# Patient Record
Sex: Male | Born: 1955
Health system: Southern US, Community
[De-identification: ages and names within clinical notes are randomized; demographics above are authoritative.]

## PROBLEM LIST (undated history)

## (undated) DIAGNOSIS — I10 Essential (primary) hypertension: Secondary | ICD-10-CM

---

## 2003-02-19 ENCOUNTER — Encounter: Admission: RE | Admit: 2003-02-19 | Discharge: 2003-02-19 | Payer: Self-pay | Admitting: Endocrinology

## 2003-02-19 ENCOUNTER — Encounter: Payer: Self-pay | Admitting: Endocrinology

## 2004-05-05 ENCOUNTER — Ambulatory Visit (HOSPITAL_COMMUNITY): Admission: RE | Admit: 2004-05-05 | Discharge: 2004-05-05 | Payer: Self-pay | Admitting: Cardiology

## 2008-03-07 ENCOUNTER — Ambulatory Visit (HOSPITAL_BASED_OUTPATIENT_CLINIC_OR_DEPARTMENT_OTHER): Admission: RE | Admit: 2008-03-07 | Discharge: 2008-03-07 | Payer: Self-pay | Admitting: Surgery

## 2008-03-07 ENCOUNTER — Encounter (INDEPENDENT_AMBULATORY_CARE_PROVIDER_SITE_OTHER): Payer: Self-pay | Admitting: Surgery

## 2010-04-15 ENCOUNTER — Ambulatory Visit: Payer: Self-pay | Admitting: Vascular Surgery

## 2010-04-21 ENCOUNTER — Ambulatory Visit (HOSPITAL_COMMUNITY): Admission: RE | Admit: 2010-04-21 | Discharge: 2010-04-21 | Payer: Self-pay | Admitting: Vascular Surgery

## 2010-04-21 ENCOUNTER — Ambulatory Visit: Payer: Self-pay | Admitting: Vascular Surgery

## 2010-05-22 ENCOUNTER — Ambulatory Visit: Payer: Self-pay | Admitting: Vascular Surgery

## 2010-11-19 ENCOUNTER — Ambulatory Visit: Payer: Self-pay | Admitting: Vascular Surgery

## 2010-12-28 LAB — POCT I-STAT, CHEM 8
BUN: 18 mg/dL (ref 6–23)
Creatinine, Ser: 1 mg/dL (ref 0.4–1.5)
Hemoglobin: 15.3 g/dL (ref 13.0–17.0)
Potassium: 4.7 mEq/L (ref 3.5–5.1)
Sodium: 138 mEq/L (ref 135–145)
TCO2: 28 mmol/L (ref 0–100)

## 2011-02-24 NOTE — Assessment & Plan Note (Signed)
OFFICE VISIT   Brian Garrett, Brian Garrett  DOB:  1956-02-26                                       05/22/2010  ZOXWR#:[id number]   I saw the patient in the office today for followup after his recent left  common iliac artery PTA and stent.  This a pleasant 55 year old  gentleman.  He had had a previous right common iliac artery PTA by Dr.  Aleen Campi in 2005.  He presented with claudication in the left leg and a  diminished left femoral pulse.  He was brought in for diagnostic  arteriography.  I performed a PTA and stenting of the left common iliac  artery with a Genesis 8 x 24 balloon-expandable stent.  He did well and  he states that his claudication symptoms resolved after the procedure.  He has had no other problems and has been resuming his normal  activities.   On examination blood pressure is 118/76, heart rate is 84.  The left  groin looks fine with no hematoma.  He has palpable femoral, popliteal  and dorsalis pedis pulses bilaterally.   He had an arterial Doppler study in our office today which showed  triphasic Doppler signals on the left with an ABI of 100% on the left.  The right side he has a triphasic dorsalis pedis signal with a biphasic  posterior tibial signal and an ABI of 100%.   Overall, I am pleased his progress.  He will stay on his Plavix for 3  months and then he will switch to aspirin.  We will see him back in 6  months for a routine duplex scan.  He knows to call sooner if he has  problems.     Di Kindle. Edilia Bo, M.D.  Electronically Signed   CSD/MEDQ  D:  05/22/2010  T:  05/23/2010  Job:  [id number]   cc:   Brooke Bonito, M.D.

## 2011-02-24 NOTE — Consult Note (Signed)
NEW PATIENT CONSULTATION   Brian Garrett, Brian Garrett  DOB:  04/16/1956                                       04/15/2010  AVWUJ#:[id number]   The patient is a 55 year old male patient with a one year history of  severe left thigh and calf claudication symptoms which began at about  100 yards on an incline and requiring him to stop walking at one block  on level ground.  He has no history of rest pain, nonhealing ulcers,  infection or gangrene.  He does have a history of right iliac stent  placement by Dr. Charolette Child in 2005 and since then he has had no  symptoms of the contralateral right leg.  He had a lower extremity  arterial Doppler study performed at [facility] on  02/20/2010 which I have reviewed.  He has an ABI of 0.61 on the left and  1.16 on the right with waveforms consistent with left iliac occlusive  disease.   CHRONIC MEDICAL PROBLEMS:  1. Hypertension.  2. Hyperlipidemia.  3. History of hemorrhoids.  4. Peripheral vascular disease.  5. Negative for diabetes, coronary artery disease, COPD or stroke.   FAMILY HISTORY:  Positive for stroke in his mother at an elderly age of  83.  Negative for coronary artery disease or diabetes.   SOCIAL HISTORY:  He is single, has two children and works in Airline [employer].  He  smokes one to one and a half pack of cigarettes per day up until 2 years  ago for about 30 years and now smokes a pipe and drinks an occasional  beer.   REVIEW OF SYSTEMS:  Negative for anorexia, weight loss, chest pain,  dyspnea on exertion, PND, orthopnea.  Does have reflux esophagitis on  occasion, headaches, lower extremity symptoms as described in present  illness.  All other systems are negative in a full review of systems.   PHYSICAL EXAMINATION:  Vital signs:  Blood pressure 122/82, heart rate  87, respirations 14.  General:  He is a well-developed, well-nourished  male in no apparent distress, alert and oriented times 3.   HEENT:  Exam  is normal.  EOMs intact.  Neck:  Is supple, 3+ carotid pulses.  No  bruits are audible.  Chest:  Is clear with no rhonchi or wheezing.  Cardiovascular:  Regular rhythm.  No murmurs.  Abdomen:  Soft, nontender  with no masses.  Musculoskeletal:  Exam is free of major deformities.  Neurological:  Normal.  Skin:  Free of rashes.  Lower extremity exam  reveals right leg to have a 3+ femoral, popliteal and dorsalis pedis  pulse palpable.  Left leg has 1+ femoral pulse.  No popliteal or distal  pulses palpable.  There is good motion and sensation in both feet.   IMPRESSION:  Suspect the patient has a left iliac stenosis which may  well be amenable to PTA and stenting.  I have scheduled an angiogram  with possible PTA and stenting by Dr. Cari Caraway to be done on Monday  July 11 at [facility].  The risks and benefits have been thoroughly  discussed and the patient would like to proceed.  Hopefully this will  relieve his symptoms.     Brian Garrett, M.D.  Electronically Signed   JDL/MEDQ  D:  04/15/2010  T:  04/16/2010  Job:  [id number]   cc:   Brooke Bonito, M.D.

## 2011-02-24 NOTE — Op Note (Signed)
Brian Garrett, Brian Garrett                ACCOUNT NO.:  [id number]   MEDICAL RECORD NO.:  [id number]          PATIENT TYPE:  AMB   LOCATION:  DSC                          FACILITY:  [facility]   PHYSICIAN:  Thornton Park. Daphine Deutscher, MD  DATE OF BIRTH:  July 10, 1956   DATE OF PROCEDURE:  03/07/2008  DATE OF DISCHARGE:                               OPERATIVE REPORT   PREOPERATIVE DIAGNOSES:  Expanding soft tissue mass in the left  supraclavicular area consistent with lipoma, hemorrhoids.   POSTOPERATIVE DIAGNOSES:  Expanding soft tissue mass in the left  supraclavicular area consistent with lipoma, hemorrhoids.   PROCEDURE:  Excision of left supraclavicular lipoma, exam under  anesthesia, internal hemorrhoidal banding at the 11 o'clock and 5  o'clock positions with the patient in the dorsal lithotomy position,  external hemorrhoidectomy at the 5 o'clock position.   SURGEON:  Thornton Park. Daphine Deutscher, MD   ASSISTANT:  None.   ANESTHESIA:  General.   DESCRIPTION OF PROCEDURE:  Brian Garrett was taken back to room 3 at [facility] on Mar 07, 2008.  The supraclavicular mass was excised going  through a skin line incision and then getting into the old capsule and  this thing shelled out nicely.  I went below the platysma, but did not  encounter any neurovascular structures.  The base was ligated with two 4-  0 Vicryl sutures and this wound was closed with 4-0 Vicryl  subcutaneously.  Dermabond was used on the skin.   Next, went down with reprepped and draped his perianal region with  Techni-Care.  I did an exam under anesthesia and found him to have some  internal hemorrhoids.  The largest one was at the 11 o'clock position  which I double banded.  I then went to the left side of the external  hemorrhoids at the 5 o'clock position and found a fairly mobile internal  component, which I double banded first and then I excised the external  hemorrhoidal component holding it out and then undermined cutting  under  with scissors.  Cautery was used to control the bleeding of the skin and  then was reapproximated from the inside almost completely to the outside  with two interrupted 3-0 chromics.  I left some  area open for oozing if this occurred.  A regional block was done with  0.5% Marcaine with epinephrine into the sphincter muscle.  The patient  seemed to tolerate this procedure well.  Betadine ointment was massaged  into the anorectal region and he was given Tylox (30) to take if needed  for pain.      Thornton Park Daphine Deutscher, MD  Electronically Signed     MBM/MEDQ  D:  03/07/2008  T:  03/08/2008  Job:  [id number]   cc:   Brooke Bonito, M.D.

## 2011-02-27 NOTE — Cardiovascular Report (Signed)
NAME:  Brian Garrett, SCHREIER NO.:  [id number]   MEDICAL RECORD NO.:  [id number]                   PATIENT TYPE:  OIB   LOCATION:  2899                                 FACILITY:  [facility]   PHYSICIAN:  Aram Candela. Tysinger, M.D.              DATE OF BIRTH:  [birth year]   DATE OF PROCEDURE:  05/05/2004  DATE OF DISCHARGE:                              CARDIAC CATHETERIZATION   REFERRING PHYSICIAN:  Brooke Bonito, M.D.   PROCEDURES:  1. Catheter placement, right femoral artery with pressure measurements     abdominal aorta.  2. Abdominal aortogram with bilateral lower extremity runoff.  3. Angioplasty with primary stenting of the right common iliac artery.  4. Angioseal of the right femoral artery.   CARDIOLOGIST:  Aram Candela. Aleen Campi, M.D.   INDICATIONS FOR PROCEDURES:  This 55 year old male had the recent onset of  severe right lower extremity claudication, and ABIs done in our office were  markedly abnormal showing significant obstruction in the right iliofemoral  area.  He was then scheduled for peripheral angiography and possible  angioplasty.   DESCRIPTION OF PROCEDURE:  After signing an informed consent, the patient  was premedicated with 5 mg of Valium by mouth and brought to the peripheral  vascular angiography lab on the sixth floor at [facility]  His  right groin was prepped and draped in a sterile fashion and anesthetized  locally with 1% lidocaine.  A 5-French introducer sheath was inserted  percutaneously into the right femoral artery.  We were unable to pass the  Seldinger wire through the common iliac artery, and this was exchanged for a  Glidewire which easily passed through the right iliac artery and into the  abdominal aorta.  We then inserted a short 5-French pigtail catheter over  the Glidewire and advanced it to the mid abdominal aorta.  Pressures were  recorded, and injections were made into the abdominal aorta.  The pigtail  catheter  was switched onto the aortic bifurcation, and further injections  were made visualizing the pelvic arteries.  After noting a critic stenosis  in the right common iliac artery of 95%, we elected to proceed with  angioplasty prior to doing the lower extremity runoff.   We then exchanged the 5-French sheath with a long 7-French sheath which was  advanced to the right common iliac artery.  With the Seldinger wire across  the lesion, we then selected a 39 x 8 mm Genesis stent deployment system  which, after proper preparation, was inserted over the Seldinger wire and  positioned within the right common iliac artery.  This stent was then  deployed with two inflations, the first at 12 atmospheres for 35 seconds and  the second at 14 atmospheres for 20 seconds.  After removing the deployment  balloon, injections were made showing an excellent angiographic result, and  we then reinserted the 5-French pigtail catheter which  was positioned in the  distal abdominal aorta.  A bolus injection was then made with bolus runoff  cineangiography performed in both lower extremities.  We then prepared to do  an Angioseal; however, the Seldinger wire would not go cleanly back through  the stent, and we felt that the distal end of the stent was not fully  deployed because of a post stenotic dilation in that area.  We then  reinserted the Seldinger wire, which easily went through with the J exposed,  and then selected a Powerflex 10 x 2 balloon which was advanced over this  Seldinger wire and positioned in the distal portion of the stent.  We then  made an inflation in the distal segment of the stent at 14 atmospheres for  30 seconds.  We then advanced it into the proximal portion of the stent and  made an inflation at 8 atmospheres for 25 seconds.  After these final  inflations, the Powerflex balloon was removed, and the last injection into  the right iliac artery showed an excellent angiographic result with  full  deployment throughout the stent and no evidence of dissection or clot.  There was very good runoff.   The patient tolerated the procedure well, and no complications were noted at  the end of procedure.  The catheter and sheath were removed from the right  femoral artery, and hemostasis was easily obtained with an Angioseal closure  system.   MEDICATIONS GIVEN:  Heparin 3500 units IV.   CINE FINDINGS:  1. Abdominal aorta:  The abdominal aorta is normal in appearance.  2. Renal arteries:  The renal arteries are also normal.  3. Pelvic arteries:  There was a focal concentric 95% stenosis in the mid     right common iliac artery with slow distal flow.  The remainder of the     iliac artery appeared normal, and the common femoral artery was also     normal.  The left common iliac, external iliac, and common femoral     arteries were normal.  4. Lower extremities:     a. Right lower extremity:  The superficial femoral and popliteal appear        normal.  There was total occlusion of the distal right anterior tibial        artery.  The peroneal and posterior tibial appeared normal.  There was        distal flow in the anterior tibial through collaterals.     b. Left lower extremity:  The left superficial femoral, the left        popliteal appear normal.  There is 95% stenotic lesion in the mid left        posterior tibial artery with reconstitution distally from collaterals.        The peroneal and anterior tibial appear normal.   ANGIOPLASTY CINE:  Cines taken during the angioplasty procedure showed  proper positioning of the guidewire and stent deployment system.  Followup  cine showed a very good deployment of the stent with very good appearance.  Final cines following the deployment with the 10 mm balloon showed an  excellent angiographic result with 0% residual stenosis and full apposition of the stent in the common iliac artery wall.  There was normal antegrade  flow and no  evidence for dissection or clot.   FINAL DIAGNOSES:  1. A 95% stenosis of the right common iliac artery.  2. Total occlusion of the distal right anterior tibial  artery.  3. A 95% stenosis of the mid left posterior tibial artery.  4. Successful angioplasty with primary stenting in the right common iliac     artery lesion.  5. Successful Angioseal of the right femoral artery.   DISPOSITION:  1. Will monitor on the short-stay unit prior to discharge home when stable.  2. We will continue the daily aspirin and encourage to increase his walking     again.                                               John R. Aleen Campi, M.D.    JRT/MEDQ  D:  05/05/2004  T:  05/05/2004  Job:  [id number]   cc:   Brooke Bonito, M.D.  [address]  [place]  [place] [address]  Fax: [phone]   Peripheral Artery Angiography Lab  6th Floor, [facility]

## 2011-07-08 LAB — BASIC METABOLIC PANEL
Calcium: 9.5
GFR calc Af Amer: >60
GFR calc non Af Amer: >60
Glucose, Bld: 119 — ABNORMAL HIGH
Potassium: 5.2 — ABNORMAL HIGH
Sodium: 143

## 2012-09-01 ENCOUNTER — Encounter: Payer: Self-pay | Admitting: Vascular Surgery

## 2014-12-07 ENCOUNTER — Other Ambulatory Visit: Payer: Self-pay | Admitting: Gastroenterology

## 2017-10-11 DIAGNOSIS — E789 Disorder of lipoprotein metabolism, unspecified: Secondary | ICD-10-CM | POA: Diagnosis not present

## 2017-10-11 DIAGNOSIS — I10 Essential (primary) hypertension: Secondary | ICD-10-CM | POA: Diagnosis not present

## 2017-10-14 DIAGNOSIS — Z0000 Encounter for general adult medical examination without abnormal findings: Secondary | ICD-10-CM | POA: Diagnosis not present

## 2017-10-14 DIAGNOSIS — E789 Disorder of lipoprotein metabolism, unspecified: Secondary | ICD-10-CM | POA: Diagnosis not present

## 2017-10-14 DIAGNOSIS — Z125 Encounter for screening for malignant neoplasm of prostate: Secondary | ICD-10-CM | POA: Diagnosis not present

## 2017-10-21 DIAGNOSIS — I10 Essential (primary) hypertension: Secondary | ICD-10-CM | POA: Diagnosis not present

## 2017-10-21 DIAGNOSIS — Z0000 Encounter for general adult medical examination without abnormal findings: Secondary | ICD-10-CM | POA: Diagnosis not present

## 2017-12-02 DIAGNOSIS — E789 Disorder of lipoprotein metabolism, unspecified: Secondary | ICD-10-CM | POA: Diagnosis not present

## 2017-12-02 DIAGNOSIS — I10 Essential (primary) hypertension: Secondary | ICD-10-CM | POA: Diagnosis not present

## 2017-12-07 DIAGNOSIS — M791 Myalgia, unspecified site: Secondary | ICD-10-CM | POA: Diagnosis not present

## 2017-12-07 DIAGNOSIS — R110 Nausea: Secondary | ICD-10-CM | POA: Diagnosis not present

## 2017-12-07 DIAGNOSIS — J181 Lobar pneumonia, unspecified organism: Secondary | ICD-10-CM | POA: Diagnosis not present

## 2017-12-12 ENCOUNTER — Emergency Department (HOSPITAL_COMMUNITY)
Admission: EM | Admit: 2017-12-12 | Discharge: 2017-12-12 | Disposition: A | Discharge status: Home / Self Care | Payer: 59 | Attending: Emergency Medicine | Admitting: Emergency Medicine

## 2017-12-12 ENCOUNTER — Emergency Department (HOSPITAL_COMMUNITY): Payer: 59

## 2017-12-12 ENCOUNTER — Encounter (HOSPITAL_COMMUNITY): Payer: Self-pay

## 2017-12-12 DIAGNOSIS — Z87891 Personal history of nicotine dependence: Secondary | ICD-10-CM | POA: Insufficient documentation

## 2017-12-12 DIAGNOSIS — R0602 Shortness of breath: Secondary | ICD-10-CM | POA: Diagnosis not present

## 2017-12-12 DIAGNOSIS — R05 Cough: Secondary | ICD-10-CM | POA: Diagnosis not present

## 2017-12-12 DIAGNOSIS — Z79899 Other long term (current) drug therapy: Secondary | ICD-10-CM | POA: Insufficient documentation

## 2017-12-12 DIAGNOSIS — J181 Lobar pneumonia, unspecified organism: Secondary | ICD-10-CM | POA: Diagnosis not present

## 2017-12-12 DIAGNOSIS — J9801 Acute bronchospasm: Secondary | ICD-10-CM | POA: Diagnosis not present

## 2017-12-12 DIAGNOSIS — I10 Essential (primary) hypertension: Secondary | ICD-10-CM | POA: Diagnosis not present

## 2017-12-12 DIAGNOSIS — R059 Cough, unspecified: Secondary | ICD-10-CM

## 2017-12-12 HISTORY — DX: Essential (primary) hypertension: I10

## 2017-12-12 LAB — CBC WITH DIFFERENTIAL/PLATELET
BASOS ABS: 0 10*3/uL (ref 0.0–0.1)
Basophils Relative: 0 %
Eosinophils Absolute: 0.1 10*3/uL (ref 0.0–0.7)
Eosinophils Relative: 2 %
HEMATOCRIT: 40.5 % (ref 39.0–52.0)
HEMOGLOBIN: 14.2 g/dL (ref 13.0–17.0)
LYMPHS ABS: 2 10*3/uL (ref 0.7–4.0)
LYMPHS PCT: 26 %
MCH: 31.3 pg (ref 26.0–34.0)
MCHC: 35.1 g/dL (ref 30.0–36.0)
MCV: 89.2 fL (ref 78.0–100.0)
Monocytes Absolute: 0.8 10*3/uL (ref 0.1–1.0)
Monocytes Relative: 10 %
NEUTROS ABS: 4.9 10*3/uL (ref 1.7–7.7)
NEUTROS PCT: 62 %
Platelets: 329 10*3/uL (ref 150–400)
RBC: 4.54 MIL/uL (ref 4.22–5.81)
RDW: 13.5 % (ref 11.5–15.5)
WBC: 7.8 10*3/uL (ref 4.0–10.5)

## 2017-12-12 LAB — BASIC METABOLIC PANEL
ANION GAP: 12 (ref 5–15)
BUN: 22 mg/dL — ABNORMAL HIGH (ref 6–20)
CHLORIDE: 98 mmol/L — AB (ref 101–111)
CO2: 23 mmol/L (ref 22–32)
Calcium: 8.9 mg/dL (ref 8.9–10.3)
Creatinine, Ser: 1.28 mg/dL — ABNORMAL HIGH (ref 0.61–1.24)
GFR calc Af Amer: >60 mL/min (ref >60)
GFR calc non Af Amer: 58 mL/min — ABNORMAL LOW (ref >60)
GLUCOSE: 109 mg/dL — AB (ref 65–99)
POTASSIUM: 4.2 mmol/L (ref 3.5–5.1)
Sodium: 133 mmol/L — ABNORMAL LOW (ref 135–145)

## 2017-12-12 MED ORDER — PREDNISONE 20 MG PO TABS: 20.0000 mg | ORAL_TABLET | Freq: Two times a day (BID) | ORAL | 0 refills | Status: AC

## 2017-12-12 MED ORDER — ALBUTEROL SULFATE (2.5 MG/3ML) 0.083% IN NEBU
2.5000 mg | INHALATION_SOLUTION | Freq: Once | RESPIRATORY_TRACT | Status: AC
  Administered 2017-12-12: 2.5 mg via RESPIRATORY_TRACT
  Filled 2017-12-12: qty 3

## 2017-12-12 MED ORDER — HYDROCODONE-ACETAMINOPHEN 7.5-325 MG/15ML PO SOLN
10.0000 mL | Freq: Once | ORAL | Status: AC
  Administered 2017-12-12: 10 mL via ORAL
  Filled 2017-12-12: qty 15

## 2017-12-12 MED ORDER — ALBUTEROL SULFATE HFA 108 (90 BASE) MCG/ACT IN AERS: 1.0000 | INHALATION_SPRAY | Freq: Four times a day (QID) | RESPIRATORY_TRACT | 0 refills | Status: AC | PRN

## 2017-12-12 MED ORDER — PREDNISONE 20 MG PO TABS
60.0000 mg | ORAL_TABLET | Freq: Once | ORAL | Status: AC
  Administered 2017-12-12: 60 mg via ORAL
  Filled 2017-12-12: qty 3

## 2017-12-12 MED ORDER — ALBUTEROL SULFATE (2.5 MG/3ML) 0.083% IN NEBU
5.0000 mg | INHALATION_SOLUTION | Freq: Once | RESPIRATORY_TRACT | Status: AC
  Administered 2017-12-12: 5 mg via RESPIRATORY_TRACT
  Filled 2017-12-12: qty 6

## 2017-12-12 MED ORDER — PROMETHAZINE-DM 6.25-15 MG/5ML PO SYRP: 5.0000 mL | ORAL_SOLUTION | Freq: Four times a day (QID) | ORAL | 0 refills | Status: AC | PRN

## 2017-12-12 NOTE — ED Notes (Signed)
Breathing treatment completed. RN made aware.

## 2017-12-12 NOTE — ED Provider Notes (Signed)
[facility] EMERGENCY DEPARTMENT Provider Note   CSN: [id number] Arrival date & time: 12/12/17  1350     History   Chief Complaint Chief Complaint  Patient presents with  . Pneumonia  . Influenza    HPI Brian Garrett is a 62 y.o. male.  If complaint is cough, and shortness of breath.  HPI: 62 year old male.  History of smoking.  Quit a few years ago.  No known lung disease or COPD.  Does not use inhalers at home.  Started feeling poorly a week ago.  Was seen in urgent care.  Diagnosed with a positive flu swab.  Was also told he may have pneumonia based on exam.  Was treated with Levaquin times 5 days, and Tamiflu times 5 days.  He finished these 2 days ago.  Has continued cough.  Shortness of breath with exertion.  Cannot LEEP due to his cough.  Dry nonproductive.  No fever.  No chest pain.  No peripheral edema.  Past Medical History:  Diagnosis Date  . Hypertension     There are no active problems to display for this patient.   History reviewed. No pertinent surgical history.     Home Medications    Prior to Admission medications   Medication Sig Start Date End Date Taking? Authorizing Provider  albuterol (PROVENTIL HFA;VENTOLIN HFA) 108 (90 Base) MCG/ACT inhaler Inhale 1-2 puffs into the lungs every 6 (six) hours as needed for wheezing. 12/12/17   Rolland Porter, MD  predniSONE (DELTASONE) 20 MG tablet Take 1 tablet (20 mg total) by mouth 2 (two) times daily with a meal. 12/12/17   Rolland Porter, MD  promethazine-dextromethorphan (PROMETHAZINE-DM) 6.25-15 MG/5ML syrup Take 5 mLs by mouth 4 (four) times daily as needed for cough. 12/12/17   Rolland Porter, MD    Family History No family history on file.  Social History Social History   Tobacco Use  . Smoking status: Former Games developer  . Smokeless tobacco: Never Used  Substance Use Topics  . Alcohol use: No    Frequency: Never  . Drug use: No     Allergies   Patient has no known allergies.   Review of  Systems Review of Systems  Constitutional: Negative for appetite change, chills, diaphoresis, fatigue and fever.  HENT: Negative for mouth sores, sore throat and trouble swallowing.   Eyes: Negative for visual disturbance.  Respiratory: Positive for cough, shortness of breath and wheezing. Negative for chest tightness.   Cardiovascular: Negative for chest pain.  Gastrointestinal: Negative for abdominal distention, abdominal pain, diarrhea, nausea and vomiting.  Endocrine: Negative for polydipsia, polyphagia and polyuria.  Genitourinary: Negative for dysuria, frequency and hematuria.  Musculoskeletal: Negative for gait problem.  Skin: Negative for color change, pallor and rash.  Neurological: Negative for dizziness, syncope, light-headedness and headaches.  Hematological: Does not bruise/bleed easily.  Psychiatric/Behavioral: Positive for sleep disturbance. Negative for behavioral problems and confusion.     Physical Exam Updated Vital Signs BP (!) 149/86 (BP Location: Right Arm)   Pulse 89   Temp 99.3 F (37.4 C) (Oral)   Resp 17   SpO2 92%   Physical Exam  Constitutional: He is oriented to person, place, and time. He appears well-developed and well-nourished. No distress.  HENT:  Head: Normocephalic.  Eyes: Conjunctivae are normal. Pupils are equal, round, and reactive to light. No scleral icterus.  Neck: Normal range of motion. Neck supple. No thyromegaly present.  Cardiovascular: Normal rate and regular rhythm. Exam reveals no gallop  and no friction rub.  No murmur heard. Pulmonary/Chest: Effort normal and breath sounds normal. No respiratory distress. He has no wheezes. He has no rales.  Mild wheezing with prolongation.  Overall saturating well and good air exchange.  Abdominal: Soft. Bowel sounds are normal. He exhibits no distension. There is no tenderness. There is no rebound.  Musculoskeletal: Normal range of motion.  Neurological: He is alert and oriented to person,  place, and time.  Skin: Skin is warm and dry. No rash noted.  Psychiatric: He has a normal mood and affect. His behavior is normal.     ED Treatments / Results  Labs (all labs ordered are listed, but only abnormal results are displayed) Labs Reviewed  BASIC METABOLIC PANEL - Abnormal; Notable for the following components:      Result Value   Sodium 133 (*)    Chloride 98 (*)    Glucose, Bld 109 (*)    BUN 22 (*)    Creatinine, Ser 1.28 (*)    GFR calc non Af Amer 58 (*)    All other components within normal limits  CBC WITH DIFFERENTIAL/PLATELET    EKG  EKG Interpretation None       Radiology Dg Chest 2 View  Result Date: 12/12/2017 CLINICAL DATA:  Pt states he was dx with RLL pneumonia and the flu on Tuesday and treated with tamiflu and levaquin. Pt states he completed antibiotics and tamiflu and feels no better EXAM: CHEST  2 VIEW COMPARISON:  11/26/2016 FINDINGS: Cardiac silhouette is normal in size. Normal mediastinal and hilar contours. Clear lungs.  No pleural effusion or pneumothorax. Skeletal structures are demineralized but intact. IMPRESSION: No active cardiopulmonary disease. Electronically Signed   By: Amie Portlandavid  Ormond M.D.   On: 12/12/2017 15:20    Procedures Procedures (including critical care time)  Medications Ordered in ED Medications  albuterol (PROVENTIL) (2.5 MG/3ML) 0.083% nebulizer solution 5 mg (5 mg Nebulization Given 12/12/17 1446)  albuterol (PROVENTIL) (2.5 MG/3ML) 0.083% nebulizer solution 2.5 mg (2.5 mg Nebulization Given 12/12/17 1808)  predniSONE (DELTASONE) tablet 60 mg (60 mg Oral Given 12/12/17 1807)  HYDROcodone-acetaminophen (HYCET) 7.5-325 mg/15 ml solution 10 mL (10 mLs Oral Given 12/12/17 1808)     Initial Impression / Assessment and Plan / ED Course  I have reviewed the triage vital signs and the nursing notes.  Pertinent labs & imaging results that were available during my care of the patient were reviewed by me and considered in my  medical decision making (see chart for details).     EKG, labs, chest x-ray here are reassuring.  No infiltrate.  No CHF.  Been given nebulized albuterol treatment prior to my exam.  He is given another here.  On reexam his lungs are clear.  Given p.o. prednisone.  Plan home, inhaler, prednisone, Phenergan DM.  Primary care follow-up.  Final Clinical Impressions(s) / ED Diagnoses   Final diagnoses:  Cough  Bronchospasm    ED Discharge Orders        Ordered    predniSONE (DELTASONE) 20 MG tablet  2 times daily with meals     12/12/17 1833    albuterol (PROVENTIL HFA;VENTOLIN HFA) 108 (90 Base) MCG/ACT inhaler  Every 6 hours PRN     12/12/17 1833    promethazine-dextromethorphan (PROMETHAZINE-DM) 6.25-15 MG/5ML syrup  4 times daily PRN     12/12/17 1833       Rolland PorterJames, Kupono Marling, MD 12/12/17 2328

## 2017-12-12 NOTE — ED Triage Notes (Signed)
Pt states he was dx with RLL pneumonia and the flu on Tuesday and treated with tamiflu and levaquin. Pt states he completed antibiotics and tamiflu and feels no better. Unable to complete ADL's d/t sob with exertion and not sleeping at night d/t cough.

## 2017-12-12 NOTE — Discharge Instructions (Signed)
Promethazine-DM for cough.   Prednisone as prescribed.   Inhaler every 4 hours as needed.

## 2018-02-21 DIAGNOSIS — E789 Disorder of lipoprotein metabolism, unspecified: Secondary | ICD-10-CM | POA: Diagnosis not present

## 2018-02-28 DIAGNOSIS — R7301 Impaired fasting glucose: Secondary | ICD-10-CM | POA: Diagnosis not present

## 2018-02-28 DIAGNOSIS — I10 Essential (primary) hypertension: Secondary | ICD-10-CM | POA: Diagnosis not present

## 2018-02-28 DIAGNOSIS — E789 Disorder of lipoprotein metabolism, unspecified: Secondary | ICD-10-CM | POA: Diagnosis not present

## 2018-06-24 DIAGNOSIS — R7301 Impaired fasting glucose: Secondary | ICD-10-CM | POA: Diagnosis not present

## 2018-06-24 DIAGNOSIS — I10 Essential (primary) hypertension: Secondary | ICD-10-CM | POA: Diagnosis not present

## 2018-07-01 DIAGNOSIS — I10 Essential (primary) hypertension: Secondary | ICD-10-CM | POA: Diagnosis not present

## 2018-07-01 DIAGNOSIS — Z23 Encounter for immunization: Secondary | ICD-10-CM | POA: Diagnosis not present

## 2018-07-01 DIAGNOSIS — R7301 Impaired fasting glucose: Secondary | ICD-10-CM | POA: Diagnosis not present

## 2018-07-01 DIAGNOSIS — E789 Disorder of lipoprotein metabolism, unspecified: Secondary | ICD-10-CM | POA: Diagnosis not present

## 2018-07-18 DIAGNOSIS — Z1212 Encounter for screening for malignant neoplasm of rectum: Secondary | ICD-10-CM | POA: Diagnosis not present

## 2018-09-19 IMAGING — CR DG CHEST 2V
2 series · 2 of 2 positions shown · non-contrast
Comparison: 11/26/2016

CLINICAL DATA: Pt states he was dx with RLL pneumonia and the flu
on [REDACTED] and treated with tamiflu and levaquin. Pt states he
completed antibiotics and tamiflu and feels no better

EXAM:
CHEST  2 VIEW

[chest pa]
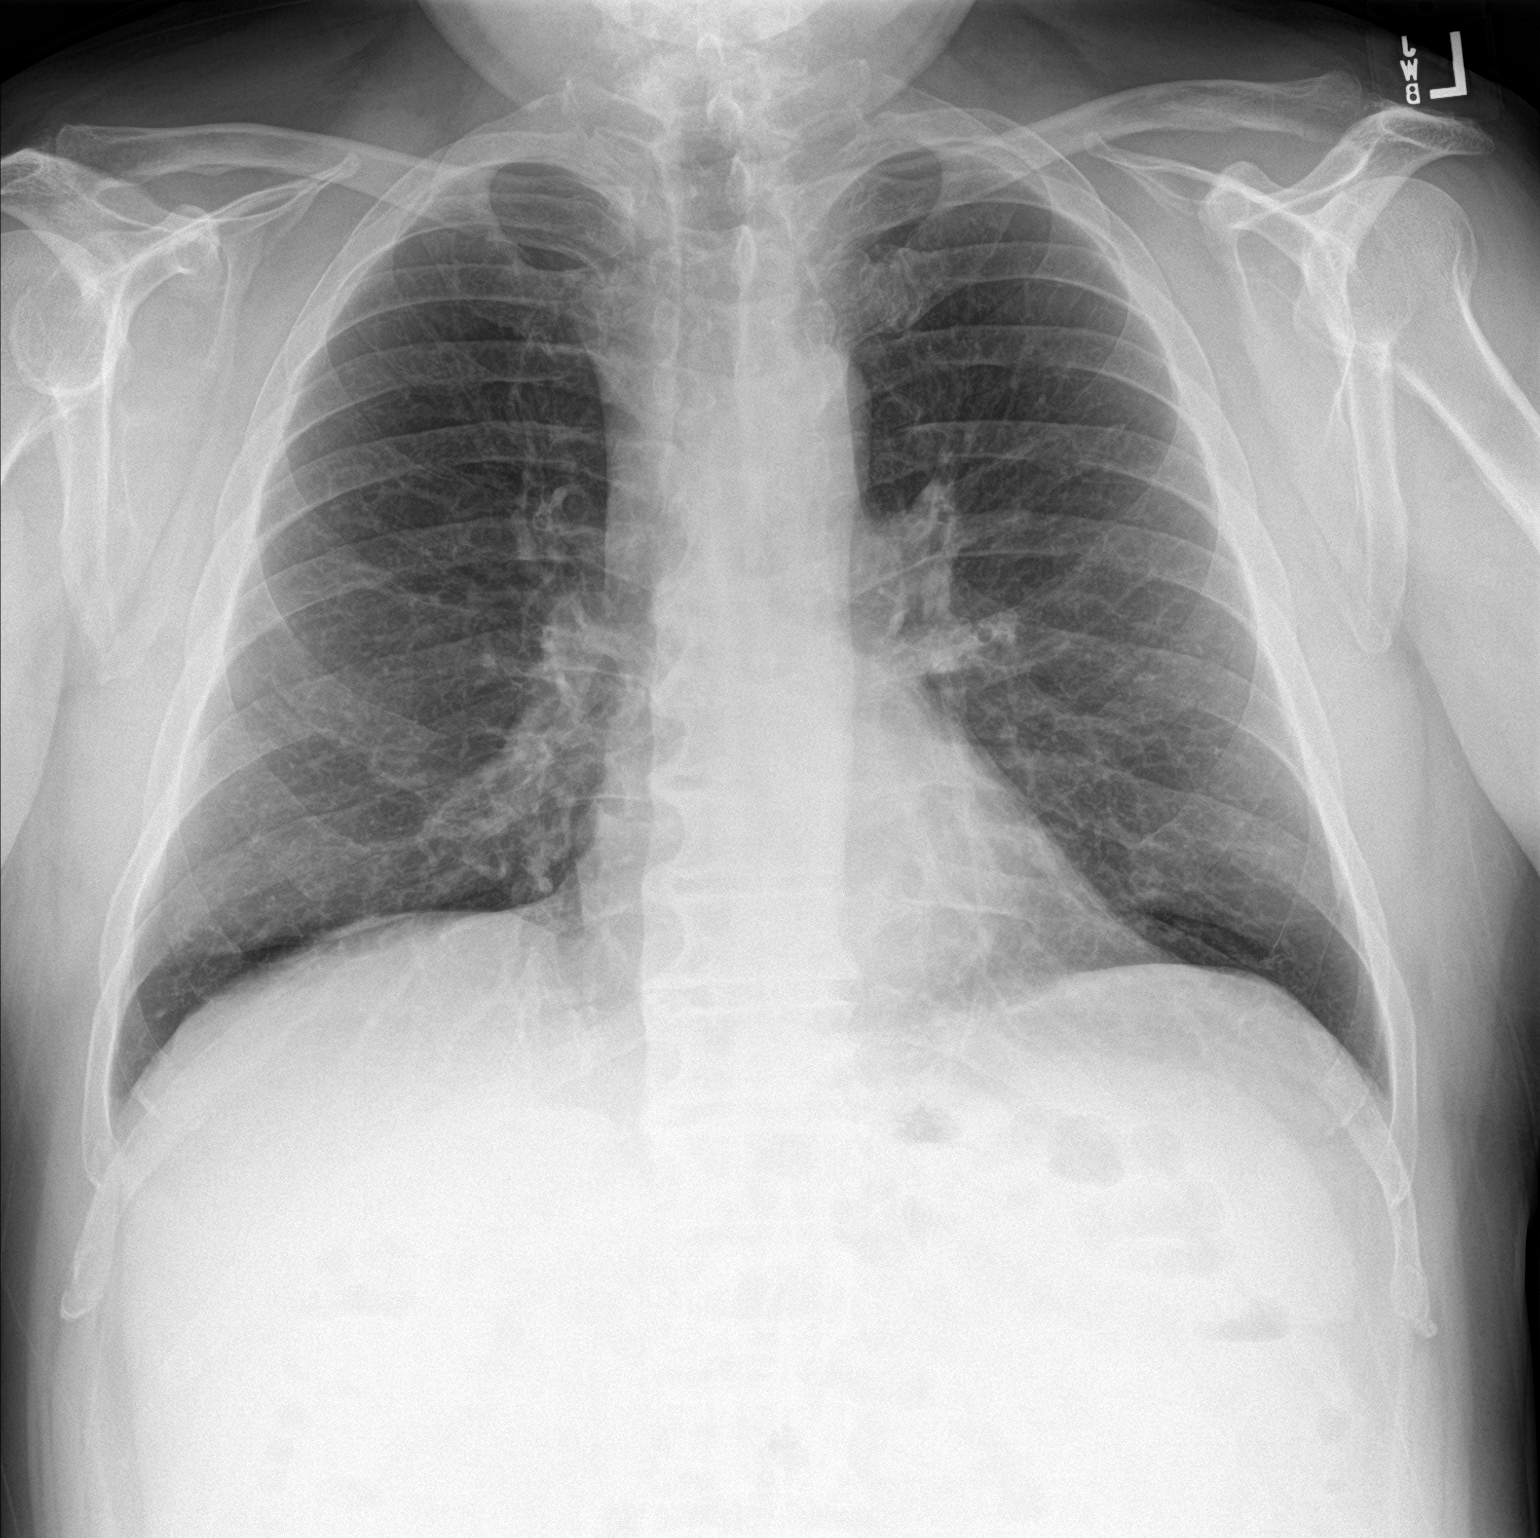

[chest lat]
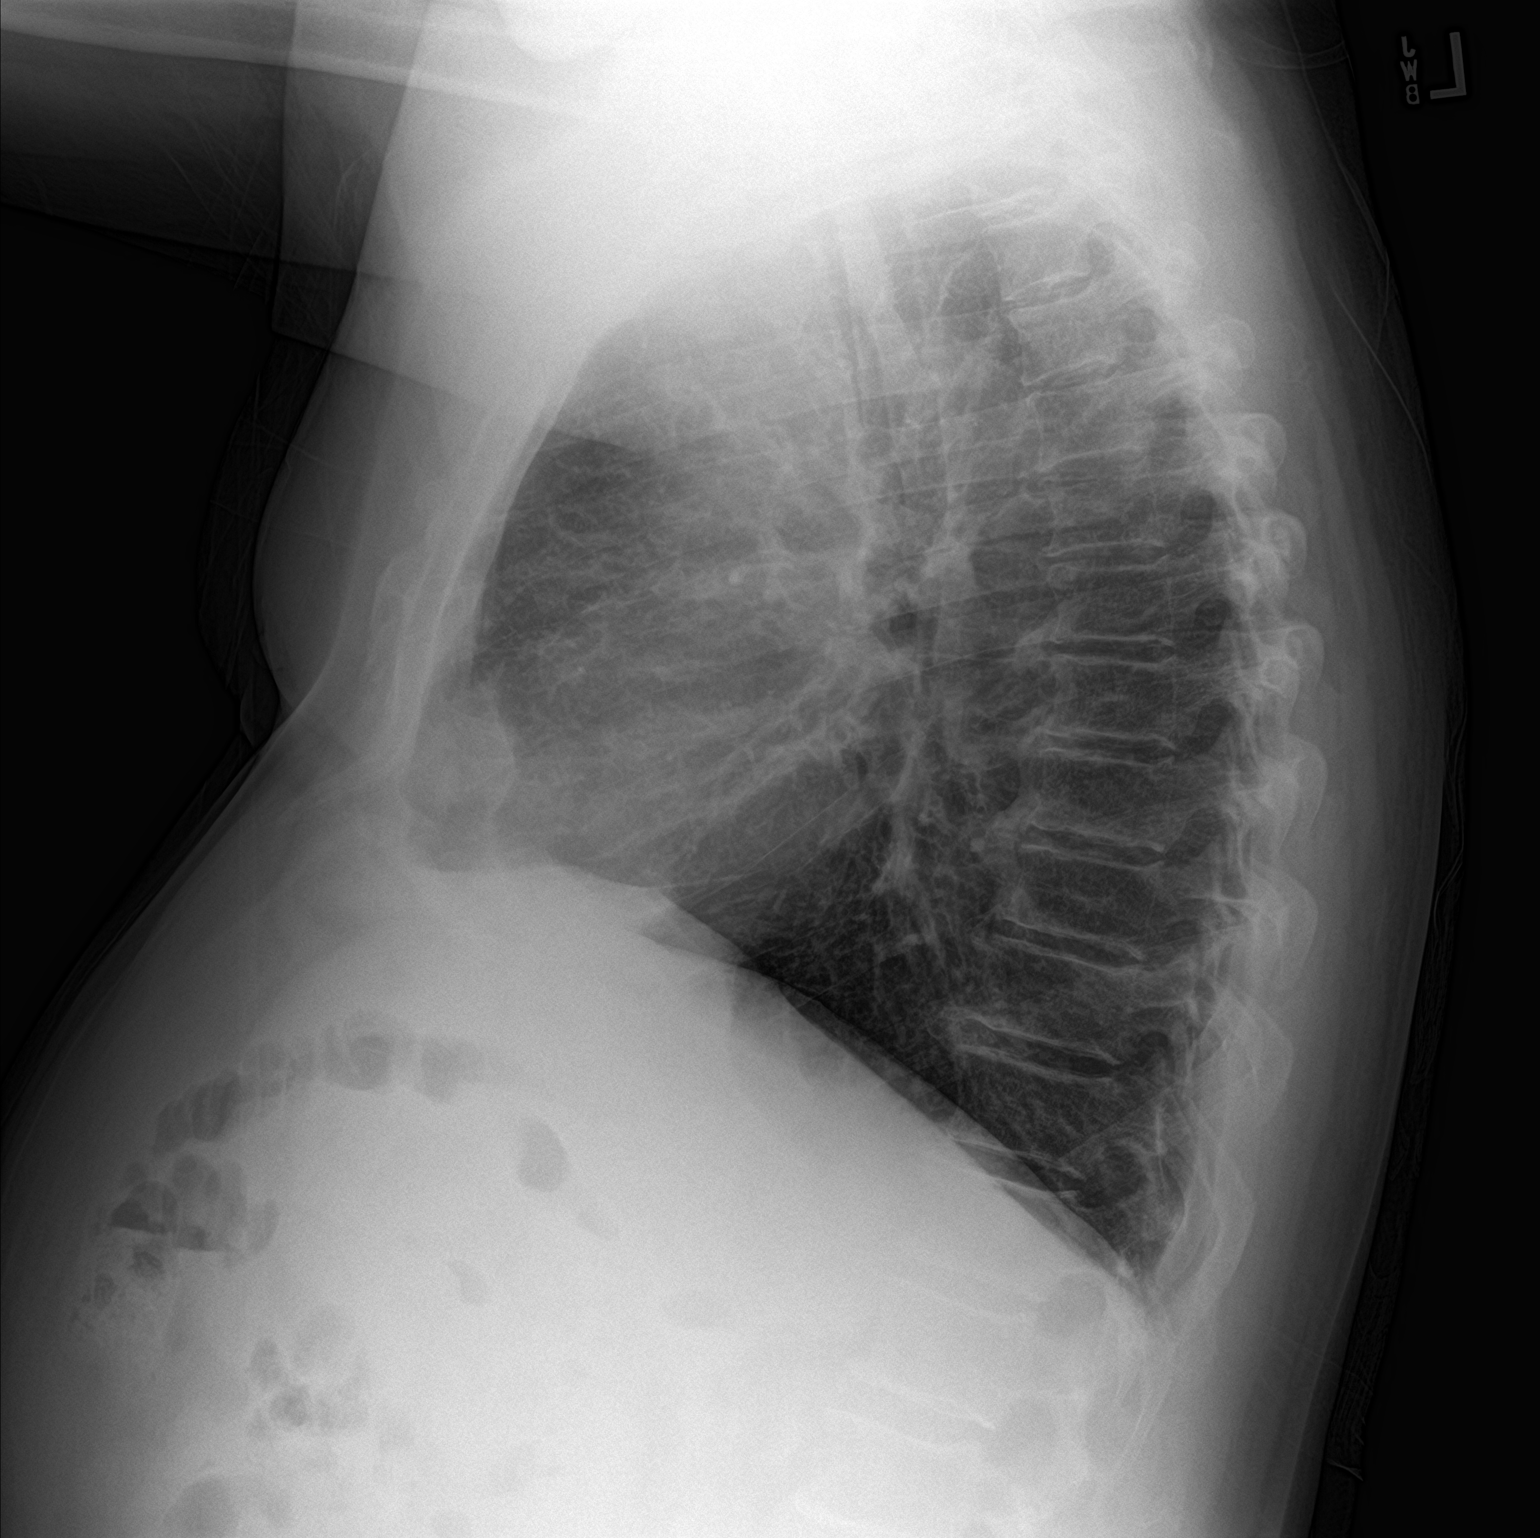

[2 of 2 positions shown; findings below may reference images not displayed]

FINDINGS: Cardiac silhouette is normal in size. Normal mediastinal and hilar
contours.

Clear lungs.  No pleural effusion or pneumothorax.

Skeletal structures are demineralized but intact.
IMPRESSION: No active cardiopulmonary disease.

## 2018-10-26 DIAGNOSIS — R7301 Impaired fasting glucose: Secondary | ICD-10-CM | POA: Diagnosis not present

## 2018-10-26 DIAGNOSIS — E789 Disorder of lipoprotein metabolism, unspecified: Secondary | ICD-10-CM | POA: Diagnosis not present

## 2018-10-26 DIAGNOSIS — I10 Essential (primary) hypertension: Secondary | ICD-10-CM | POA: Diagnosis not present

## 2018-10-26 DIAGNOSIS — Z0000 Encounter for general adult medical examination without abnormal findings: Secondary | ICD-10-CM | POA: Diagnosis not present

## 2019-09-13 DIAGNOSIS — R739 Hyperglycemia, unspecified: Secondary | ICD-10-CM | POA: Diagnosis not present

## 2019-09-13 DIAGNOSIS — I10 Essential (primary) hypertension: Secondary | ICD-10-CM | POA: Diagnosis not present

## 2019-09-13 DIAGNOSIS — E7800 Pure hypercholesterolemia, unspecified: Secondary | ICD-10-CM | POA: Diagnosis not present

## 2019-09-20 DIAGNOSIS — I10 Essential (primary) hypertension: Secondary | ICD-10-CM | POA: Diagnosis not present

## 2019-09-20 DIAGNOSIS — E7800 Pure hypercholesterolemia, unspecified: Secondary | ICD-10-CM | POA: Diagnosis not present

## 2019-09-20 DIAGNOSIS — N529 Male erectile dysfunction, unspecified: Secondary | ICD-10-CM | POA: Diagnosis not present

## 2019-09-20 DIAGNOSIS — R7301 Impaired fasting glucose: Secondary | ICD-10-CM | POA: Diagnosis not present

## 2019-09-20 DIAGNOSIS — I739 Peripheral vascular disease, unspecified: Secondary | ICD-10-CM | POA: Diagnosis not present

## 2019-11-01 ENCOUNTER — Ambulatory Visit: Payer: Medicare HMO | Attending: Internal Medicine

## 2019-11-01 DIAGNOSIS — Z23 Encounter for immunization: Secondary | ICD-10-CM

## 2019-11-22 ENCOUNTER — Ambulatory Visit: Payer: Medicare HMO | Attending: Internal Medicine

## 2019-11-22 DIAGNOSIS — Z23 Encounter for immunization: Secondary | ICD-10-CM | POA: Insufficient documentation

## 2019-11-22 NOTE — Progress Notes (Signed)
   Covid-19 Vaccination Clinic  Name:  Brian Garrett    MRN: [id number] DOB: [birth year]  11/22/2019  Brian Garrett was observed post Covid-19 immunization for 15 minutes without incidence. He was provided with Vaccine Information Sheet and instruction to access the V-Safe system.   Brian Garrett was instructed to call 911 with any severe reactions post vaccine: Marland Kitchen Difficulty breathing  . Swelling of your face and throat  . A fast heartbeat  . A bad rash all over your body  . Dizziness and weakness    Immunizations Administered    Name Date Dose VIS Date Route   Pfizer COVID-19 Vaccine 11/22/2019 10:58 AM 0.3 mL 09/22/2019 Intramuscular   Manufacturer: [employer], [employer]   Lot: [id number]   NDC: [address]-9

## 2019-12-12 DIAGNOSIS — R7301 Impaired fasting glucose: Secondary | ICD-10-CM | POA: Diagnosis not present

## 2019-12-12 DIAGNOSIS — E7800 Pure hypercholesterolemia, unspecified: Secondary | ICD-10-CM | POA: Diagnosis not present

## 2020-01-15 DIAGNOSIS — E7800 Pure hypercholesterolemia, unspecified: Secondary | ICD-10-CM | POA: Diagnosis not present

## 2020-01-15 DIAGNOSIS — F5101 Primary insomnia: Secondary | ICD-10-CM | POA: Diagnosis not present

## 2020-01-15 DIAGNOSIS — I739 Peripheral vascular disease, unspecified: Secondary | ICD-10-CM | POA: Diagnosis not present

## 2020-01-15 DIAGNOSIS — R7301 Impaired fasting glucose: Secondary | ICD-10-CM | POA: Diagnosis not present

## 2020-01-15 DIAGNOSIS — I10 Essential (primary) hypertension: Secondary | ICD-10-CM | POA: Diagnosis not present

## 2020-05-13 DIAGNOSIS — I10 Essential (primary) hypertension: Secondary | ICD-10-CM | POA: Diagnosis not present

## 2020-05-13 DIAGNOSIS — E7800 Pure hypercholesterolemia, unspecified: Secondary | ICD-10-CM | POA: Diagnosis not present

## 2020-05-13 DIAGNOSIS — R7301 Impaired fasting glucose: Secondary | ICD-10-CM | POA: Diagnosis not present

## 2020-05-13 DIAGNOSIS — Z125 Encounter for screening for malignant neoplasm of prostate: Secondary | ICD-10-CM | POA: Diagnosis not present

## 2020-05-20 DIAGNOSIS — G2581 Restless legs syndrome: Secondary | ICD-10-CM | POA: Diagnosis not present

## 2020-05-20 DIAGNOSIS — I739 Peripheral vascular disease, unspecified: Secondary | ICD-10-CM | POA: Diagnosis not present

## 2020-05-20 DIAGNOSIS — E7800 Pure hypercholesterolemia, unspecified: Secondary | ICD-10-CM | POA: Diagnosis not present

## 2020-05-20 DIAGNOSIS — F5101 Primary insomnia: Secondary | ICD-10-CM | POA: Diagnosis not present

## 2020-05-20 DIAGNOSIS — I10 Essential (primary) hypertension: Secondary | ICD-10-CM | POA: Diagnosis not present

## 2020-05-20 DIAGNOSIS — R7301 Impaired fasting glucose: Secondary | ICD-10-CM | POA: Diagnosis not present

## 2020-05-20 DIAGNOSIS — Z0000 Encounter for general adult medical examination without abnormal findings: Secondary | ICD-10-CM | POA: Diagnosis not present

## 2020-07-30 DIAGNOSIS — Z1159 Encounter for screening for other viral diseases: Secondary | ICD-10-CM | POA: Diagnosis not present

## 2020-08-02 DIAGNOSIS — K640 First degree hemorrhoids: Secondary | ICD-10-CM | POA: Diagnosis not present

## 2020-08-02 DIAGNOSIS — Z86010 Personal history of colonic polyps: Secondary | ICD-10-CM | POA: Diagnosis not present

## 2020-08-02 DIAGNOSIS — K52832 Lymphocytic colitis: Secondary | ICD-10-CM | POA: Diagnosis not present

## 2020-08-06 DIAGNOSIS — K52832 Lymphocytic colitis: Secondary | ICD-10-CM | POA: Diagnosis not present

## 2020-11-13 DIAGNOSIS — R7301 Impaired fasting glucose: Secondary | ICD-10-CM | POA: Diagnosis not present

## 2020-11-13 DIAGNOSIS — E7800 Pure hypercholesterolemia, unspecified: Secondary | ICD-10-CM | POA: Diagnosis not present

## 2020-11-13 DIAGNOSIS — R7989 Other specified abnormal findings of blood chemistry: Secondary | ICD-10-CM | POA: Diagnosis not present

## 2020-11-13 DIAGNOSIS — I10 Essential (primary) hypertension: Secondary | ICD-10-CM | POA: Diagnosis not present

## 2020-11-20 DIAGNOSIS — E7800 Pure hypercholesterolemia, unspecified: Secondary | ICD-10-CM | POA: Diagnosis not present

## 2020-11-20 DIAGNOSIS — Z23 Encounter for immunization: Secondary | ICD-10-CM | POA: Diagnosis not present

## 2020-11-20 DIAGNOSIS — F5101 Primary insomnia: Secondary | ICD-10-CM | POA: Diagnosis not present

## 2020-11-20 DIAGNOSIS — I10 Essential (primary) hypertension: Secondary | ICD-10-CM | POA: Diagnosis not present

## 2021-02-07 DIAGNOSIS — H2513 Age-related nuclear cataract, bilateral: Secondary | ICD-10-CM | POA: Diagnosis not present

## 2021-02-07 DIAGNOSIS — H40033 Anatomical narrow angle, bilateral: Secondary | ICD-10-CM | POA: Diagnosis not present

## 2021-05-20 DIAGNOSIS — R739 Hyperglycemia, unspecified: Secondary | ICD-10-CM | POA: Diagnosis not present

## 2021-05-20 DIAGNOSIS — F5101 Primary insomnia: Secondary | ICD-10-CM | POA: Diagnosis not present

## 2021-05-20 DIAGNOSIS — I10 Essential (primary) hypertension: Secondary | ICD-10-CM | POA: Diagnosis not present

## 2021-05-20 DIAGNOSIS — E7800 Pure hypercholesterolemia, unspecified: Secondary | ICD-10-CM | POA: Diagnosis not present

## 2021-05-20 DIAGNOSIS — D649 Anemia, unspecified: Secondary | ICD-10-CM | POA: Diagnosis not present

## 2021-05-20 DIAGNOSIS — Z0000 Encounter for general adult medical examination without abnormal findings: Secondary | ICD-10-CM | POA: Diagnosis not present

## 2021-05-20 DIAGNOSIS — Z125 Encounter for screening for malignant neoplasm of prostate: Secondary | ICD-10-CM | POA: Diagnosis not present

## 2021-05-27 DIAGNOSIS — Z0000 Encounter for general adult medical examination without abnormal findings: Secondary | ICD-10-CM | POA: Diagnosis not present

## 2021-05-27 DIAGNOSIS — E7800 Pure hypercholesterolemia, unspecified: Secondary | ICD-10-CM | POA: Diagnosis not present

## 2021-05-27 DIAGNOSIS — I739 Peripheral vascular disease, unspecified: Secondary | ICD-10-CM | POA: Diagnosis not present

## 2021-05-27 DIAGNOSIS — R7301 Impaired fasting glucose: Secondary | ICD-10-CM | POA: Diagnosis not present

## 2021-05-27 DIAGNOSIS — I10 Essential (primary) hypertension: Secondary | ICD-10-CM | POA: Diagnosis not present

## 2021-05-27 DIAGNOSIS — K529 Noninfective gastroenteritis and colitis, unspecified: Secondary | ICD-10-CM | POA: Diagnosis not present

## 2021-05-27 DIAGNOSIS — N529 Male erectile dysfunction, unspecified: Secondary | ICD-10-CM | POA: Diagnosis not present

## 2021-11-24 DIAGNOSIS — R7989 Other specified abnormal findings of blood chemistry: Secondary | ICD-10-CM | POA: Diagnosis not present

## 2021-11-24 DIAGNOSIS — D649 Anemia, unspecified: Secondary | ICD-10-CM | POA: Diagnosis not present

## 2021-11-24 DIAGNOSIS — I10 Essential (primary) hypertension: Secondary | ICD-10-CM | POA: Diagnosis not present

## 2021-11-24 DIAGNOSIS — E7800 Pure hypercholesterolemia, unspecified: Secondary | ICD-10-CM | POA: Diagnosis not present

## 2021-11-24 DIAGNOSIS — R7301 Impaired fasting glucose: Secondary | ICD-10-CM | POA: Diagnosis not present

## 2021-11-24 DIAGNOSIS — F5101 Primary insomnia: Secondary | ICD-10-CM | POA: Diagnosis not present

## 2021-12-01 DIAGNOSIS — Z6831 Body mass index (BMI) 31.0-31.9, adult: Secondary | ICD-10-CM | POA: Diagnosis not present

## 2021-12-01 DIAGNOSIS — F5101 Primary insomnia: Secondary | ICD-10-CM | POA: Diagnosis not present

## 2021-12-01 DIAGNOSIS — G2581 Restless legs syndrome: Secondary | ICD-10-CM | POA: Diagnosis not present

## 2021-12-01 DIAGNOSIS — E7800 Pure hypercholesterolemia, unspecified: Secondary | ICD-10-CM | POA: Diagnosis not present

## 2021-12-01 DIAGNOSIS — Z125 Encounter for screening for malignant neoplasm of prostate: Secondary | ICD-10-CM | POA: Diagnosis not present

## 2021-12-01 DIAGNOSIS — I10 Essential (primary) hypertension: Secondary | ICD-10-CM | POA: Diagnosis not present

## 2021-12-01 DIAGNOSIS — I739 Peripheral vascular disease, unspecified: Secondary | ICD-10-CM | POA: Diagnosis not present

## 2021-12-01 DIAGNOSIS — E6609 Other obesity due to excess calories: Secondary | ICD-10-CM | POA: Diagnosis not present

## 2021-12-01 DIAGNOSIS — R7303 Prediabetes: Secondary | ICD-10-CM | POA: Diagnosis not present

## 2022-04-20 DIAGNOSIS — H524 Presbyopia: Secondary | ICD-10-CM | POA: Diagnosis not present

## 2022-04-20 DIAGNOSIS — H5203 Hypermetropia, bilateral: Secondary | ICD-10-CM | POA: Diagnosis not present

## 2022-05-29 DIAGNOSIS — E7800 Pure hypercholesterolemia, unspecified: Secondary | ICD-10-CM | POA: Diagnosis not present

## 2022-05-29 DIAGNOSIS — E6609 Other obesity due to excess calories: Secondary | ICD-10-CM | POA: Diagnosis not present

## 2022-05-29 DIAGNOSIS — R7303 Prediabetes: Secondary | ICD-10-CM | POA: Diagnosis not present

## 2022-05-29 DIAGNOSIS — Z125 Encounter for screening for malignant neoplasm of prostate: Secondary | ICD-10-CM | POA: Diagnosis not present

## 2022-06-08 ENCOUNTER — Other Ambulatory Visit: Payer: Self-pay

## 2022-06-08 DIAGNOSIS — Z23 Encounter for immunization: Secondary | ICD-10-CM | POA: Diagnosis not present

## 2022-06-08 DIAGNOSIS — R7301 Impaired fasting glucose: Secondary | ICD-10-CM | POA: Diagnosis not present

## 2022-06-08 DIAGNOSIS — I739 Peripheral vascular disease, unspecified: Secondary | ICD-10-CM | POA: Diagnosis not present

## 2022-06-08 DIAGNOSIS — N529 Male erectile dysfunction, unspecified: Secondary | ICD-10-CM | POA: Diagnosis not present

## 2022-06-08 DIAGNOSIS — I10 Essential (primary) hypertension: Secondary | ICD-10-CM | POA: Diagnosis not present

## 2022-06-08 DIAGNOSIS — Z0000 Encounter for general adult medical examination without abnormal findings: Secondary | ICD-10-CM | POA: Diagnosis not present

## 2022-06-08 DIAGNOSIS — E7800 Pure hypercholesterolemia, unspecified: Secondary | ICD-10-CM

## 2022-06-08 DIAGNOSIS — K529 Noninfective gastroenteritis and colitis, unspecified: Secondary | ICD-10-CM | POA: Diagnosis not present

## 2022-06-22 ENCOUNTER — Ambulatory Visit
Admission: RE | Admit: 2022-06-22 | Discharge: 2022-06-22 | Disposition: A | Discharge status: Home / Self Care | Payer: Medicare HMO | Source: Ambulatory Visit

## 2022-06-22 DIAGNOSIS — E7800 Pure hypercholesterolemia, unspecified: Secondary | ICD-10-CM

## 2022-06-22 DIAGNOSIS — I700 Atherosclerosis of aorta: Secondary | ICD-10-CM | POA: Diagnosis not present

## 2022-12-02 DIAGNOSIS — E7800 Pure hypercholesterolemia, unspecified: Secondary | ICD-10-CM | POA: Diagnosis not present

## 2022-12-02 DIAGNOSIS — I10 Essential (primary) hypertension: Secondary | ICD-10-CM | POA: Diagnosis not present

## 2022-12-02 DIAGNOSIS — D649 Anemia, unspecified: Secondary | ICD-10-CM | POA: Diagnosis not present

## 2022-12-02 DIAGNOSIS — R7301 Impaired fasting glucose: Secondary | ICD-10-CM | POA: Diagnosis not present

## 2022-12-09 DIAGNOSIS — K529 Noninfective gastroenteritis and colitis, unspecified: Secondary | ICD-10-CM | POA: Diagnosis not present

## 2022-12-09 DIAGNOSIS — R7303 Prediabetes: Secondary | ICD-10-CM | POA: Diagnosis not present

## 2022-12-09 DIAGNOSIS — Z125 Encounter for screening for malignant neoplasm of prostate: Secondary | ICD-10-CM | POA: Diagnosis not present

## 2022-12-09 DIAGNOSIS — E7800 Pure hypercholesterolemia, unspecified: Secondary | ICD-10-CM | POA: Diagnosis not present

## 2022-12-09 DIAGNOSIS — N529 Male erectile dysfunction, unspecified: Secondary | ICD-10-CM | POA: Diagnosis not present

## 2022-12-09 DIAGNOSIS — I739 Peripheral vascular disease, unspecified: Secondary | ICD-10-CM | POA: Diagnosis not present

## 2022-12-09 DIAGNOSIS — I10 Essential (primary) hypertension: Secondary | ICD-10-CM | POA: Diagnosis not present

## 2023-06-03 DIAGNOSIS — R7303 Prediabetes: Secondary | ICD-10-CM | POA: Diagnosis not present

## 2023-06-03 DIAGNOSIS — E7800 Pure hypercholesterolemia, unspecified: Secondary | ICD-10-CM | POA: Diagnosis not present

## 2023-06-03 DIAGNOSIS — Z0000 Encounter for general adult medical examination without abnormal findings: Secondary | ICD-10-CM | POA: Diagnosis not present

## 2023-06-03 DIAGNOSIS — Z125 Encounter for screening for malignant neoplasm of prostate: Secondary | ICD-10-CM | POA: Diagnosis not present

## 2023-06-03 DIAGNOSIS — I10 Essential (primary) hypertension: Secondary | ICD-10-CM | POA: Diagnosis not present

## 2023-06-10 DIAGNOSIS — N529 Male erectile dysfunction, unspecified: Secondary | ICD-10-CM | POA: Diagnosis not present

## 2023-06-10 DIAGNOSIS — E7800 Pure hypercholesterolemia, unspecified: Secondary | ICD-10-CM | POA: Diagnosis not present

## 2023-06-10 DIAGNOSIS — Z0000 Encounter for general adult medical examination without abnormal findings: Secondary | ICD-10-CM | POA: Diagnosis not present

## 2023-06-10 DIAGNOSIS — I739 Peripheral vascular disease, unspecified: Secondary | ICD-10-CM | POA: Diagnosis not present

## 2023-06-10 DIAGNOSIS — K529 Noninfective gastroenteritis and colitis, unspecified: Secondary | ICD-10-CM | POA: Diagnosis not present

## 2023-06-10 DIAGNOSIS — R0683 Snoring: Secondary | ICD-10-CM | POA: Diagnosis not present

## 2023-06-10 DIAGNOSIS — R7303 Prediabetes: Secondary | ICD-10-CM | POA: Diagnosis not present

## 2023-06-10 DIAGNOSIS — R5383 Other fatigue: Secondary | ICD-10-CM | POA: Diagnosis not present

## 2023-06-10 DIAGNOSIS — I10 Essential (primary) hypertension: Secondary | ICD-10-CM | POA: Diagnosis not present

## 2023-08-12 ENCOUNTER — Encounter: Payer: Self-pay | Admitting: Internal Medicine

## 2023-08-12 ENCOUNTER — Encounter: Payer: Self-pay | Admitting: Pulmonary Disease

## 2023-11-08 DIAGNOSIS — E7801 Familial hypercholesterolemia: Secondary | ICD-10-CM | POA: Diagnosis not present

## 2023-11-08 DIAGNOSIS — I10 Essential (primary) hypertension: Secondary | ICD-10-CM | POA: Diagnosis not present

## 2023-11-08 DIAGNOSIS — H5203 Hypermetropia, bilateral: Secondary | ICD-10-CM | POA: Diagnosis not present

## 2023-11-08 DIAGNOSIS — H524 Presbyopia: Secondary | ICD-10-CM | POA: Diagnosis not present

## 2023-12-06 DIAGNOSIS — E7800 Pure hypercholesterolemia, unspecified: Secondary | ICD-10-CM | POA: Diagnosis not present

## 2023-12-06 DIAGNOSIS — R7303 Prediabetes: Secondary | ICD-10-CM | POA: Diagnosis not present

## 2023-12-06 DIAGNOSIS — I10 Essential (primary) hypertension: Secondary | ICD-10-CM | POA: Diagnosis not present

## 2023-12-13 DIAGNOSIS — R0683 Snoring: Secondary | ICD-10-CM | POA: Diagnosis not present

## 2023-12-13 DIAGNOSIS — K529 Noninfective gastroenteritis and colitis, unspecified: Secondary | ICD-10-CM | POA: Diagnosis not present

## 2023-12-13 DIAGNOSIS — R7303 Prediabetes: Secondary | ICD-10-CM | POA: Diagnosis not present

## 2023-12-13 DIAGNOSIS — N529 Male erectile dysfunction, unspecified: Secondary | ICD-10-CM | POA: Diagnosis not present

## 2023-12-13 DIAGNOSIS — E7800 Pure hypercholesterolemia, unspecified: Secondary | ICD-10-CM | POA: Diagnosis not present

## 2023-12-13 DIAGNOSIS — Z125 Encounter for screening for malignant neoplasm of prostate: Secondary | ICD-10-CM | POA: Diagnosis not present

## 2023-12-13 DIAGNOSIS — R5383 Other fatigue: Secondary | ICD-10-CM | POA: Diagnosis not present

## 2023-12-13 DIAGNOSIS — I739 Peripheral vascular disease, unspecified: Secondary | ICD-10-CM | POA: Diagnosis not present

## 2023-12-13 DIAGNOSIS — I10 Essential (primary) hypertension: Secondary | ICD-10-CM | POA: Diagnosis not present

## 2024-06-07 DIAGNOSIS — E7800 Pure hypercholesterolemia, unspecified: Secondary | ICD-10-CM | POA: Diagnosis not present

## 2024-06-07 DIAGNOSIS — Z125 Encounter for screening for malignant neoplasm of prostate: Secondary | ICD-10-CM | POA: Diagnosis not present

## 2024-06-07 DIAGNOSIS — R0683 Snoring: Secondary | ICD-10-CM | POA: Diagnosis not present

## 2024-06-07 DIAGNOSIS — R5383 Other fatigue: Secondary | ICD-10-CM | POA: Diagnosis not present

## 2024-06-07 DIAGNOSIS — R7303 Prediabetes: Secondary | ICD-10-CM | POA: Diagnosis not present

## 2024-06-14 DIAGNOSIS — N529 Male erectile dysfunction, unspecified: Secondary | ICD-10-CM | POA: Diagnosis not present

## 2024-06-14 DIAGNOSIS — E7800 Pure hypercholesterolemia, unspecified: Secondary | ICD-10-CM | POA: Diagnosis not present

## 2024-06-14 DIAGNOSIS — I10 Essential (primary) hypertension: Secondary | ICD-10-CM | POA: Diagnosis not present

## 2024-06-14 DIAGNOSIS — Z23 Encounter for immunization: Secondary | ICD-10-CM | POA: Diagnosis not present

## 2024-06-14 DIAGNOSIS — Z0000 Encounter for general adult medical examination without abnormal findings: Secondary | ICD-10-CM | POA: Diagnosis not present

## 2024-06-14 DIAGNOSIS — I739 Peripheral vascular disease, unspecified: Secondary | ICD-10-CM | POA: Diagnosis not present
